# Patient Record
Sex: Female | Born: 1960 | Race: White | Hispanic: No | Marital: Married | Smoking: Never smoker
Health system: Southern US, Community
[De-identification: ages and names within clinical notes are randomized; demographics above are authoritative.]

## PROBLEM LIST (undated history)

## (undated) DIAGNOSIS — E079 Disorder of thyroid, unspecified: Secondary | ICD-10-CM

## (undated) DIAGNOSIS — K589 Irritable bowel syndrome without diarrhea: Secondary | ICD-10-CM

## (undated) DIAGNOSIS — IMO0001 Reserved for inherently not codable concepts without codable children: Secondary | ICD-10-CM

## (undated) DIAGNOSIS — G43909 Migraine, unspecified, not intractable, without status migrainosus: Secondary | ICD-10-CM

## (undated) DIAGNOSIS — K219 Gastro-esophageal reflux disease without esophagitis: Secondary | ICD-10-CM

## (undated) HISTORY — DX: Migraine, unspecified, not intractable, without status migrainosus: G43.909

## (undated) HISTORY — PX: AUGMENTATION MAMMAPLASTY: SUR837

## (undated) HISTORY — DX: Gastro-esophageal reflux disease without esophagitis: K21.9

## (undated) HISTORY — DX: Irritable bowel syndrome, unspecified: K58.9

## (undated) HISTORY — DX: Disorder of thyroid, unspecified: E07.9

## (undated) HISTORY — DX: Reserved for inherently not codable concepts without codable children: IMO0001

---

## 1997-07-24 ENCOUNTER — Other Ambulatory Visit: Admission: RE | Admit: 1997-07-24 | Discharge: 1997-07-24 | Payer: Self-pay | Admitting: Gynecology

## 1998-07-28 ENCOUNTER — Other Ambulatory Visit: Admission: RE | Admit: 1998-07-28 | Discharge: 1998-07-28 | Payer: Self-pay | Admitting: Gynecology

## 1999-10-14 ENCOUNTER — Other Ambulatory Visit: Admission: RE | Admit: 1999-10-14 | Discharge: 1999-10-14 | Payer: Self-pay | Admitting: Gynecology

## 2000-10-17 ENCOUNTER — Other Ambulatory Visit: Admission: RE | Admit: 2000-10-17 | Discharge: 2000-10-17 | Payer: Self-pay | Admitting: Gynecology

## 2001-05-02 HISTORY — PX: LAPAROSCOPIC HYSTERECTOMY: SHX1926

## 2001-10-23 ENCOUNTER — Other Ambulatory Visit: Admission: RE | Admit: 2001-10-23 | Discharge: 2001-10-23 | Payer: Self-pay | Admitting: Gynecology

## 2001-11-12 ENCOUNTER — Inpatient Hospital Stay (HOSPITAL_COMMUNITY): Admission: RE | Admit: 2001-11-12 | Discharge: 2001-11-14 | Payer: Self-pay | Admitting: Gynecology

## 2001-11-12 ENCOUNTER — Encounter (INDEPENDENT_AMBULATORY_CARE_PROVIDER_SITE_OTHER): Payer: Self-pay | Admitting: *Deleted

## 2010-04-15 ENCOUNTER — Encounter
Admission: RE | Admit: 2010-04-15 | Discharge: 2010-04-15 | Payer: Self-pay | Source: Home / Self Care | Attending: Gynecology | Admitting: Gynecology

## 2010-08-26 ENCOUNTER — Other Ambulatory Visit: Payer: Self-pay | Admitting: Gynecology

## 2010-08-26 DIAGNOSIS — Z09 Encounter for follow-up examination after completed treatment for conditions other than malignant neoplasm: Secondary | ICD-10-CM

## 2010-09-13 ENCOUNTER — Ambulatory Visit
Admission: RE | Admit: 2010-09-13 | Discharge: 2010-09-13 | Disposition: A | Discharge status: Home / Self Care | Payer: BC Managed Care – PPO | Source: Ambulatory Visit | Attending: Gynecology | Admitting: Gynecology

## 2010-09-13 DIAGNOSIS — Z09 Encounter for follow-up examination after completed treatment for conditions other than malignant neoplasm: Secondary | ICD-10-CM

## 2010-09-17 NOTE — Op Note (Signed)
[facility]  Patient:    Leslie Walton, Leslie Walton Visit Number: [id number] MRN: [id number]          Service Type: GYN Location: [id number] [id number] Attending Physician:  Susa Raring Dictated by:   Luvenia Redden, M.D. Proc. Date: 11/12/01 Admit Date:  11/12/2001 Discharge Date: 11/14/2001                             Operative Report  PREOPERATIVE DIAGNOSES:       1. Chronic pelvic pain, dysmenorrhea,                                  dyspareunia.                               2. Severe menstrual migraines.                               3. Possible endometriosis.  POSTOPERATIVE DIAGNOSES:      1. Chronic pelvic pain, dysmenorrhea,                                  dyspareunia.                               2. Severe menstrual migraines.                               3. Pelvic endometriosis.  OPERATION:                    1. Total abdominal hysterectomy, bilateral                                  salpingo-oophorectomy.                               2. Electrocauterization of pelvic endometriosis.  SURGEON:                      Luvenia Redden, M.D.  ASSISTANT:                    Janeece Riggers. Dareen Piano, M.D.  ANESTHESIA:                   General endotracheal.  PROCEDURE:                    Under good anesthesia the patient was prepped and draped in a sterile manner.  Bladder was catheterized with a Foley catheter.  Pfannenstiel incision was made.  Bleeders were clamped and cauterized as encountered.  Peritoneum was entered sharply and opened vertically.  Upper abdominal organ exploration showed no abnormalities.  In the pelvis the uterus was retroflexed, was slightly enlarged and boggy. Previous tubal sterilization was apparent.  Ovaries appeared normal.  There was a corpus luteum present in the right ovary.  Down in the cul-de-sac below the cervix there were several  implants of typical powder burn appearance endometriosis.  The round ligaments were suture  ligated and divided.  Bladder flap was developed by sharp and blunt dissection.  The ureters were identified, were functioning, and were well below the operative site.  The infundibulopelvic ligaments were clamped, cut, and doubly ligated.  Uterine vessels were skeletonized, clamped, cut, and ligated.  Bladder was dissected down off the cervix further by blunt dissection.  The cardinal ligaments were clamped, cut, and ligated.  Uterosacral ligaments were clamped, cut, and ligated.  The vagina was entered anteriorly.  The cervix was circumscribed and the specimen removed.  The lateral vaginal apices were secured with figure-of-eight 0 Monocryl sutures and the cuff was closed with figure-of-eight sutures.  Individual bleeders underneath the bladder flap were electrocoagulated.  Pelvis was irrigated with warm lactated Ringers and this was aspirated.  There was a small bleeder at the right apex of the cuff and this was suture ligated with a Monocryl suture.  Ureters were again inspected and were functioning.  The urine was clear.  Packs were removed.  Retractor was removed.  The appendix was inspected and was normal.  After all the counts were correct the peritoneum was closed with continuous 2-0 Monocryl.  Rectus muscles approximated in the midline with continuous Monocryl.  Fascia was repaired with continuous 0 Monocryl.  The subcutaneous space was irrigated copiously.  There were small bleeders that were electrocoagulated.  Skin was then closed with wide skin staples.  Dry, sterile dressing was applied. Estimated blood loss was 50-100 cc.  None was replaced.  The patient tolerated the procedure well and was removed to recovery in good condition. Dictated by:   Luvenia Redden, M.D. Attending Physician:  Susa Raring DD:  11/12/01 TD:  11/15/01 Job: [id number]

## 2010-09-17 NOTE — Discharge Summary (Signed)
NAME:  Leslie, Walton NO.:  [id number]   MEDICAL RECORD NO.:  [id number]                   PATIENT TYPE:   LOCATION:                                       FACILITY:   PHYSICIAN:  Luvenia Redden, M.D.                DATE OF BIRTH:   DATE OF ADMISSION:  11/12/2001  DATE OF DISCHARGE:  11/14/2001                                 DISCHARGE SUMMARY   HISTORY OF PRESENT ILLNESS:  This patient is a 50 year old female admitted  to the hospital with long-term complaints of chronic pelvic pain,  dyspareunia, dysmenorrhea, and menstrual headaches.  Has been a little  response to conservative therapy and her symptoms have been increasing with  the passage of time.  She is now admitted for surgical treatment and  definitive therapy.   PHYSICAL EXAMINATION:  PELVIC:  Pertinent findings __________ pelvis were  external genitalia that is normal with a parous outlet.  The vagina is lined  with healthy mucosa.  The cervix smooth and well-supported.  The uterus is  anterior and normal size.  It is tender to palpation.  It is tender to  movement.  No adnexal masses can be palpated.  RECTAL:  Negative.   LABORATORY DATA:  Laboratory data including an admission hemogram,  urinalysis, and urine pregnancy test were all within normal limits.  The  pregnancy test was negative and the preop hemoglobin was 12, postop  hemoglobin 10.9.   HOSPITAL COURSE:  The patient was taken to the operating room where a total  abdominal hysterectomy bilateral salpingo-oophorectomy was performed.  She  also had peritoneal implants of endometriosis and these were destroyed by  electrocoagulation.  The patient's postop course was one of steady  improvement and no complications.  On the first postoperative day, she was  afebrile and taking p.o. fluids.  She had not passed flatus.  IV was removed  and she was ambulated.  On July 16th, she was afebrile and had no bleeding.  Staples were  removed from her wound and Steri-Strips applied.  The patient  was taking postop solids and was passing flatus.  She was discharged to her  home.   DISCHARGE INSTRUCTIONS:  The patient was instructed to eat a regular diet  and had limited activity including but not limited to no vigorous activity  and no vaginal penetration.  She was instructed not to drive for two weeks.   DISCHARGE MEDICATIONS:  She was given a prescription of Premarin 0.9 mg to  be taken orally daily.  She was given a prescription for Mepergan for her  pain as needed.   FOLLOW UP:  She is to return to my office in two weeks time for followup.   PATHOLOGY:  Examination of the tissue removed was benign.  The right ovary  had a hemorrhage corpus luteum cyst.  On the left side, there was  a  paratubal cyst and some cystic follicles.  Endometrium was benign.  The  myometrium had extensive adenomyosis.   FINAL DIAGNOSES:  1. Chronic pelvic pain, dysmenorrhea, and dyspareunia.  2. Extensive adenomyosis of the myometrium.  3. Left paratubal cyst.  4. Hemorrhagic corpus luteum cyst of the ovary.  5. Squamous metaplasia.  6. Pelvic peritoneal endometriosis.  7. Cyclic premenstrual headaches.   OPERATION:  1. Total abdominal hysterectomy, bilateral salpingo-oophorectomy.  2. Destruction of pelvic peritoneal implants of endometriosis.   CONDITION ON DISCHARGE:  Improved.                                                Luvenia Redden, M.D.    SWB/MEDQ  D:  01/17/2002  T:  01/18/2002  Job:  [id number]

## 2010-10-04 ENCOUNTER — Other Ambulatory Visit: Payer: Self-pay | Admitting: Gynecology

## 2011-05-03 HISTORY — PX: CHOLECYSTECTOMY: SHX55

## 2011-10-05 ENCOUNTER — Other Ambulatory Visit: Payer: Self-pay | Admitting: Gynecology

## 2012-03-01 ENCOUNTER — Other Ambulatory Visit: Payer: Self-pay | Admitting: Gynecology

## 2012-03-01 DIAGNOSIS — Z1231 Encounter for screening mammogram for malignant neoplasm of breast: Secondary | ICD-10-CM

## 2012-03-28 ENCOUNTER — Ambulatory Visit
Admission: RE | Admit: 2012-03-28 | Discharge: 2012-03-28 | Disposition: A | Discharge status: Home / Self Care | Payer: BC Managed Care – PPO | Source: Ambulatory Visit | Attending: Gynecology | Admitting: Gynecology

## 2012-03-28 DIAGNOSIS — Z1231 Encounter for screening mammogram for malignant neoplasm of breast: Secondary | ICD-10-CM

## 2012-10-17 ENCOUNTER — Other Ambulatory Visit: Payer: Self-pay | Admitting: Obstetrics and Gynecology

## 2013-10-01 ENCOUNTER — Other Ambulatory Visit: Payer: Self-pay

## 2013-10-01 DIAGNOSIS — Z1231 Encounter for screening mammogram for malignant neoplasm of breast: Secondary | ICD-10-CM

## 2013-10-18 ENCOUNTER — Ambulatory Visit
Admission: RE | Admit: 2013-10-18 | Discharge: 2013-10-18 | Disposition: A | Discharge status: Home / Self Care | Payer: BC Managed Care – PPO | Source: Ambulatory Visit

## 2013-10-18 ENCOUNTER — Encounter (INDEPENDENT_AMBULATORY_CARE_PROVIDER_SITE_OTHER): Payer: Self-pay

## 2013-10-18 DIAGNOSIS — Z1231 Encounter for screening mammogram for malignant neoplasm of breast: Secondary | ICD-10-CM

## 2014-02-28 ENCOUNTER — Ambulatory Visit (INDEPENDENT_AMBULATORY_CARE_PROVIDER_SITE_OTHER): Payer: BC Managed Care – PPO

## 2014-02-28 VITALS — BP 129/79 | HR 60 | Resp 12

## 2014-02-28 DIAGNOSIS — M79675 Pain in left toe(s): Secondary | ICD-10-CM

## 2014-02-28 DIAGNOSIS — S90122A Contusion of left lesser toe(s) without damage to nail, initial encounter: Secondary | ICD-10-CM

## 2014-02-28 DIAGNOSIS — B351 Tinea unguium: Secondary | ICD-10-CM

## 2014-02-28 NOTE — Progress Notes (Signed)
   Subjective:    Patient ID: Candee FurbishSusan Young, female    DOB: [birth year], 53 y.o.   MRN: [id number]  HPI  PT STATED LT FOOT 2ND MEDIAL SIDE OF THE TOENAIL IS BEEN SORE FOR 4-5 YEARS. THE TOENAIL IS BEEN THE SAME, BUT WHEN THE TOENAIL GET LONGER IS GETTING WORSE. THE TOENAIL GET AGGRAVATED BY PRESSURE. TRIED TO KEEP IT TRIM DOWN AND IT HELPS.  Review of Systems  Genitourinary: Negative for urgency.  Neurological: Positive for headaches.  All other systems reviewed and are negative.      Objective:   Physical Exam 53 year old white female well-developed well-nourished aren't attempts and presents at this time with pain second digit left foot more so than right there is some yellowing and thickening of the nail with some subungual debris. Patient does not report any injury or trauma however is a runner and currently the shoe she is wearing her size over on exam of the insole the shoe reveals that the toes are hitting always to the ends of her shoes she has second toes longer than the hallux on both feet resulting in contusion distal tuft of the second digit and second nail plate. Thickening debris of the nail plate otherwise neurovascular status is intact mild semirigid digital contractures noted no open wounds or ulcers no secondary infections       Assessment & Plan:  Assessment this time is slight hammertoe deformity with long second digit and contusion of second toe. Possible he secondary onychomycosis due to contusion plan at this time patient will apply topical Fungi-Nail to the affected nails will go up half a size in her shoes as appropriate reevaluate within 3 or 6 months if no significant improvement or if exacerbation occurs next  Alvan Dameichard Vaishnav Demartin DPM

## 2014-02-28 NOTE — Patient Instructions (Signed)
Onychomycosis/Fungal Toenails  WHAT IS IT? An infection that lies within the keratin of your nail plate that is caused by a fungus.  WHY ME? Fungal infections affect all ages, sexes, races, and creeds.  There may be many factors that predispose you to a fungal infection such as age, coexisting medical conditions such as diabetes, or an autoimmune disease; stress, medications, fatigue, genetics, etc.  Bottom line: fungus thrives in a warm, moist environment and your shoes offer such a location.  IS IT CONTAGIOUS? Theoretically, yes.  You do not want to share shoes, nail clippers or files with someone who has fungal toenails.  Walking around barefoot in the same room or sleeping in the same bed is unlikely to transfer the organism.  It is important to realize, however, that fungus can spread easily from one nail to the next on the same foot.  HOW DO WE TREAT THIS?  There are several ways to treat this condition.  Treatment may depend on many factors such as age, medications, pregnancy, liver and kidney conditions, etc.  It is best to ask your doctor which options are available to you.  1. No treatment.   Unlike many other medical concerns, you can live with this condition.  However for many people this can be a painful condition and may lead to ingrown toenails or a bacterial infection.  It is recommended that you keep the nails cut short to help reduce the amount of fungal nail. 2. Topical treatment.  These range from herbal remedies to prescription strength nail lacquers.  About 40-50% effective, topicals require twice daily application for approximately 9 to 12 months or until an entirely new nail has grown out.  The most effective topicals are medical grade medications available through physicians offices. 3. Oral antifungal medications.  With an 80-90% cure rate, the most common oral medication requires 3 to 4 months of therapy and stays in your system for a year as the new nail grows out.  Oral  antifungal medications do require blood work to make sure it is a safe drug for you.  A liver function panel will be performed prior to starting the medication and after the first month of treatment.  It is important to have the blood work performed to avoid any harmful side effects.  In general, this medication safe but blood work is required. 4. Laser Therapy.  This treatment is performed by applying a specialized laser to the affected nail plate.  This therapy is noninvasive, fast, and non-painful.  It is not covered by insurance and is therefore, out of pocket.  The results have been very good with a 80-95% cure rate.  The [facility] is the only practice in the area to offer this therapy. 5. Permanent Nail Avulsion.  Removing the entire nail so that a new nail will not grow back.   Obtain Fungi-Nail at any pharmacy without prescription. Apply to the affected nail twice daily for 3-6 months to help reduce the thickness discoloration and sensitivity. Discontinue if any skin irritation occurs

## 2014-09-03 ENCOUNTER — Other Ambulatory Visit: Payer: Self-pay

## 2014-09-03 DIAGNOSIS — Z1231 Encounter for screening mammogram for malignant neoplasm of breast: Secondary | ICD-10-CM

## 2014-10-23 ENCOUNTER — Ambulatory Visit
Admission: RE | Admit: 2014-10-23 | Discharge: 2014-10-23 | Disposition: A | Discharge status: Home / Self Care | Payer: BLUE CROSS/BLUE SHIELD | Source: Ambulatory Visit

## 2014-10-23 DIAGNOSIS — Z1231 Encounter for screening mammogram for malignant neoplasm of breast: Secondary | ICD-10-CM

## 2015-08-06 DIAGNOSIS — J3089 Other allergic rhinitis: Secondary | ICD-10-CM | POA: Diagnosis not present

## 2015-08-06 DIAGNOSIS — F4322 Adjustment disorder with anxiety: Secondary | ICD-10-CM | POA: Diagnosis not present

## 2015-08-21 DIAGNOSIS — F4322 Adjustment disorder with anxiety: Secondary | ICD-10-CM | POA: Diagnosis not present

## 2015-09-11 DIAGNOSIS — F4322 Adjustment disorder with anxiety: Secondary | ICD-10-CM | POA: Diagnosis not present

## 2015-09-16 DIAGNOSIS — F4322 Adjustment disorder with anxiety: Secondary | ICD-10-CM | POA: Diagnosis not present

## 2015-09-18 DIAGNOSIS — F4322 Adjustment disorder with anxiety: Secondary | ICD-10-CM | POA: Diagnosis not present

## 2015-10-02 DIAGNOSIS — F4322 Adjustment disorder with anxiety: Secondary | ICD-10-CM | POA: Diagnosis not present

## 2015-10-16 DIAGNOSIS — F4322 Adjustment disorder with anxiety: Secondary | ICD-10-CM | POA: Diagnosis not present

## 2015-11-13 DIAGNOSIS — F4322 Adjustment disorder with anxiety: Secondary | ICD-10-CM | POA: Diagnosis not present

## 2015-11-24 DIAGNOSIS — E28310 Symptomatic premature menopause: Secondary | ICD-10-CM | POA: Diagnosis not present

## 2015-11-24 DIAGNOSIS — E559 Vitamin D deficiency, unspecified: Secondary | ICD-10-CM | POA: Diagnosis not present

## 2015-11-24 DIAGNOSIS — R7989 Other specified abnormal findings of blood chemistry: Secondary | ICD-10-CM | POA: Diagnosis not present

## 2015-11-24 DIAGNOSIS — E039 Hypothyroidism, unspecified: Secondary | ICD-10-CM | POA: Diagnosis not present

## 2015-11-26 DIAGNOSIS — F4322 Adjustment disorder with anxiety: Secondary | ICD-10-CM | POA: Diagnosis not present

## 2015-12-09 DIAGNOSIS — F4322 Adjustment disorder with anxiety: Secondary | ICD-10-CM | POA: Diagnosis not present

## 2016-01-13 DIAGNOSIS — F4322 Adjustment disorder with anxiety: Secondary | ICD-10-CM | POA: Diagnosis not present

## 2016-01-19 DIAGNOSIS — Z1389 Encounter for screening for other disorder: Secondary | ICD-10-CM | POA: Diagnosis not present

## 2016-01-19 DIAGNOSIS — E039 Hypothyroidism, unspecified: Secondary | ICD-10-CM | POA: Diagnosis not present

## 2016-01-19 DIAGNOSIS — J309 Allergic rhinitis, unspecified: Secondary | ICD-10-CM | POA: Diagnosis not present

## 2016-01-29 DIAGNOSIS — F4322 Adjustment disorder with anxiety: Secondary | ICD-10-CM | POA: Diagnosis not present

## 2016-02-05 DIAGNOSIS — F4322 Adjustment disorder with anxiety: Secondary | ICD-10-CM | POA: Diagnosis not present

## 2016-02-26 DIAGNOSIS — F4322 Adjustment disorder with anxiety: Secondary | ICD-10-CM | POA: Diagnosis not present

## 2016-03-04 DIAGNOSIS — F4322 Adjustment disorder with anxiety: Secondary | ICD-10-CM | POA: Diagnosis not present

## 2016-03-17 DIAGNOSIS — F4322 Adjustment disorder with anxiety: Secondary | ICD-10-CM | POA: Diagnosis not present

## 2016-03-21 DIAGNOSIS — E28310 Symptomatic premature menopause: Secondary | ICD-10-CM | POA: Diagnosis not present

## 2016-03-21 DIAGNOSIS — R7989 Other specified abnormal findings of blood chemistry: Secondary | ICD-10-CM | POA: Diagnosis not present

## 2016-03-21 DIAGNOSIS — E559 Vitamin D deficiency, unspecified: Secondary | ICD-10-CM | POA: Diagnosis not present

## 2016-03-21 DIAGNOSIS — E039 Hypothyroidism, unspecified: Secondary | ICD-10-CM | POA: Diagnosis not present

## 2016-03-31 DIAGNOSIS — F4322 Adjustment disorder with anxiety: Secondary | ICD-10-CM | POA: Diagnosis not present

## 2016-05-05 DIAGNOSIS — J019 Acute sinusitis, unspecified: Secondary | ICD-10-CM | POA: Diagnosis not present

## 2016-05-05 DIAGNOSIS — B9689 Other specified bacterial agents as the cause of diseases classified elsewhere: Secondary | ICD-10-CM | POA: Diagnosis not present

## 2016-05-06 DIAGNOSIS — F4322 Adjustment disorder with anxiety: Secondary | ICD-10-CM | POA: Diagnosis not present

## 2016-06-08 DIAGNOSIS — Z23 Encounter for immunization: Secondary | ICD-10-CM | POA: Diagnosis not present

## 2016-06-10 DIAGNOSIS — F4322 Adjustment disorder with anxiety: Secondary | ICD-10-CM | POA: Diagnosis not present

## 2016-07-08 DIAGNOSIS — F4322 Adjustment disorder with anxiety: Secondary | ICD-10-CM | POA: Diagnosis not present

## 2016-07-29 DIAGNOSIS — D8989 Other specified disorders involving the immune mechanism, not elsewhere classified: Secondary | ICD-10-CM | POA: Diagnosis not present

## 2016-07-29 DIAGNOSIS — Z79890 Hormone replacement therapy (postmenopausal): Secondary | ICD-10-CM | POA: Diagnosis not present

## 2016-07-29 DIAGNOSIS — G43101 Migraine with aura, not intractable, with status migrainosus: Secondary | ICD-10-CM | POA: Diagnosis not present

## 2016-07-29 DIAGNOSIS — F39 Unspecified mood [affective] disorder: Secondary | ICD-10-CM | POA: Diagnosis not present

## 2016-07-29 DIAGNOSIS — E7212 Methylenetetrahydrofolate reductase deficiency: Secondary | ICD-10-CM | POA: Diagnosis not present

## 2016-07-29 DIAGNOSIS — R790 Abnormal level of blood mineral: Secondary | ICD-10-CM | POA: Diagnosis not present

## 2016-07-29 DIAGNOSIS — G4700 Insomnia, unspecified: Secondary | ICD-10-CM | POA: Diagnosis not present

## 2016-07-29 DIAGNOSIS — E559 Vitamin D deficiency, unspecified: Secondary | ICD-10-CM | POA: Diagnosis not present

## 2016-07-29 DIAGNOSIS — N951 Menopausal and female climacteric states: Secondary | ICD-10-CM | POA: Diagnosis not present

## 2016-07-29 DIAGNOSIS — E039 Hypothyroidism, unspecified: Secondary | ICD-10-CM | POA: Diagnosis not present

## 2016-08-09 DIAGNOSIS — F4322 Adjustment disorder with anxiety: Secondary | ICD-10-CM | POA: Diagnosis not present

## 2016-09-20 DIAGNOSIS — E039 Hypothyroidism, unspecified: Secondary | ICD-10-CM | POA: Diagnosis not present

## 2016-09-20 DIAGNOSIS — R5382 Chronic fatigue, unspecified: Secondary | ICD-10-CM | POA: Diagnosis not present

## 2016-09-20 DIAGNOSIS — Z79890 Hormone replacement therapy (postmenopausal): Secondary | ICD-10-CM | POA: Diagnosis not present

## 2016-09-20 DIAGNOSIS — N951 Menopausal and female climacteric states: Secondary | ICD-10-CM | POA: Diagnosis not present

## 2016-09-23 ENCOUNTER — Other Ambulatory Visit: Payer: Self-pay | Admitting: Obstetrics & Gynecology

## 2016-09-23 ENCOUNTER — Other Ambulatory Visit: Payer: Self-pay | Admitting: Family Medicine

## 2016-09-23 DIAGNOSIS — Z1231 Encounter for screening mammogram for malignant neoplasm of breast: Secondary | ICD-10-CM

## 2016-10-04 DIAGNOSIS — Z01419 Encounter for gynecological examination (general) (routine) without abnormal findings: Secondary | ICD-10-CM | POA: Diagnosis not present

## 2016-10-04 DIAGNOSIS — Z6826 Body mass index (BMI) 26.0-26.9, adult: Secondary | ICD-10-CM | POA: Diagnosis not present

## 2016-10-12 DIAGNOSIS — J0100 Acute maxillary sinusitis, unspecified: Secondary | ICD-10-CM | POA: Diagnosis not present

## 2016-10-18 DIAGNOSIS — N951 Menopausal and female climacteric states: Secondary | ICD-10-CM | POA: Diagnosis not present

## 2016-10-18 DIAGNOSIS — R5382 Chronic fatigue, unspecified: Secondary | ICD-10-CM | POA: Diagnosis not present

## 2016-10-18 DIAGNOSIS — Z79890 Hormone replacement therapy (postmenopausal): Secondary | ICD-10-CM | POA: Diagnosis not present

## 2016-11-01 ENCOUNTER — Ambulatory Visit
Admission: RE | Admit: 2016-11-01 | Discharge: 2016-11-01 | Disposition: A | Discharge status: Home / Self Care | Payer: BLUE CROSS/BLUE SHIELD | Source: Ambulatory Visit | Attending: Family Medicine | Admitting: Family Medicine

## 2016-11-01 DIAGNOSIS — Z1231 Encounter for screening mammogram for malignant neoplasm of breast: Secondary | ICD-10-CM

## 2016-11-03 ENCOUNTER — Other Ambulatory Visit: Payer: Self-pay | Admitting: Family Medicine

## 2016-11-03 DIAGNOSIS — R928 Other abnormal and inconclusive findings on diagnostic imaging of breast: Secondary | ICD-10-CM

## 2016-11-08 ENCOUNTER — Other Ambulatory Visit: Payer: Self-pay | Admitting: Family Medicine

## 2016-11-08 ENCOUNTER — Ambulatory Visit
Admission: RE | Admit: 2016-11-08 | Discharge: 2016-11-08 | Disposition: A | Discharge status: Home / Self Care | Payer: BLUE CROSS/BLUE SHIELD | Source: Ambulatory Visit | Attending: Family Medicine | Admitting: Family Medicine

## 2016-11-08 DIAGNOSIS — N6001 Solitary cyst of right breast: Secondary | ICD-10-CM

## 2016-11-08 DIAGNOSIS — N6489 Other specified disorders of breast: Secondary | ICD-10-CM | POA: Diagnosis not present

## 2016-11-08 DIAGNOSIS — R928 Other abnormal and inconclusive findings on diagnostic imaging of breast: Secondary | ICD-10-CM

## 2016-11-08 DIAGNOSIS — R922 Inconclusive mammogram: Secondary | ICD-10-CM | POA: Diagnosis not present

## 2016-11-10 ENCOUNTER — Ambulatory Visit
Admission: RE | Admit: 2016-11-10 | Discharge: 2016-11-10 | Disposition: A | Discharge status: Home / Self Care | Payer: BLUE CROSS/BLUE SHIELD | Source: Ambulatory Visit | Attending: Family Medicine | Admitting: Family Medicine

## 2016-11-10 DIAGNOSIS — N6001 Solitary cyst of right breast: Secondary | ICD-10-CM | POA: Diagnosis not present

## 2017-01-11 DIAGNOSIS — R0982 Postnasal drip: Secondary | ICD-10-CM | POA: Diagnosis not present

## 2017-01-11 DIAGNOSIS — J209 Acute bronchitis, unspecified: Secondary | ICD-10-CM | POA: Diagnosis not present

## 2017-01-11 DIAGNOSIS — J309 Allergic rhinitis, unspecified: Secondary | ICD-10-CM | POA: Diagnosis not present

## 2017-01-11 DIAGNOSIS — G43701 Chronic migraine without aura, not intractable, with status migrainosus: Secondary | ICD-10-CM | POA: Diagnosis not present

## 2017-01-15 DIAGNOSIS — R05 Cough: Secondary | ICD-10-CM | POA: Diagnosis not present

## 2017-01-15 DIAGNOSIS — R062 Wheezing: Secondary | ICD-10-CM | POA: Diagnosis not present

## 2017-01-27 DIAGNOSIS — J189 Pneumonia, unspecified organism: Secondary | ICD-10-CM | POA: Diagnosis not present

## 2017-03-02 DIAGNOSIS — B9689 Other specified bacterial agents as the cause of diseases classified elsewhere: Secondary | ICD-10-CM | POA: Diagnosis not present

## 2017-03-02 DIAGNOSIS — Z6825 Body mass index (BMI) 25.0-25.9, adult: Secondary | ICD-10-CM | POA: Diagnosis not present

## 2017-03-02 DIAGNOSIS — N760 Acute vaginitis: Secondary | ICD-10-CM | POA: Diagnosis not present

## 2017-03-02 DIAGNOSIS — N3000 Acute cystitis without hematuria: Secondary | ICD-10-CM | POA: Diagnosis not present

## 2017-03-14 DIAGNOSIS — Z6826 Body mass index (BMI) 26.0-26.9, adult: Secondary | ICD-10-CM | POA: Diagnosis not present

## 2017-03-14 DIAGNOSIS — A5901 Trichomonal vulvovaginitis: Secondary | ICD-10-CM | POA: Diagnosis not present

## 2017-03-14 DIAGNOSIS — R102 Pelvic and perineal pain: Secondary | ICD-10-CM | POA: Diagnosis not present

## 2017-03-14 DIAGNOSIS — N3090 Cystitis, unspecified without hematuria: Secondary | ICD-10-CM | POA: Diagnosis not present

## 2017-07-15 DIAGNOSIS — J069 Acute upper respiratory infection, unspecified: Secondary | ICD-10-CM | POA: Diagnosis not present

## 2017-07-18 DIAGNOSIS — F4322 Adjustment disorder with anxiety: Secondary | ICD-10-CM | POA: Diagnosis not present

## 2017-07-28 DIAGNOSIS — F4322 Adjustment disorder with anxiety: Secondary | ICD-10-CM | POA: Diagnosis not present

## 2017-08-18 DIAGNOSIS — F4322 Adjustment disorder with anxiety: Secondary | ICD-10-CM | POA: Diagnosis not present

## 2017-08-31 DIAGNOSIS — F4322 Adjustment disorder with anxiety: Secondary | ICD-10-CM | POA: Diagnosis not present

## 2017-09-14 DIAGNOSIS — F4322 Adjustment disorder with anxiety: Secondary | ICD-10-CM | POA: Diagnosis not present

## 2017-09-15 DIAGNOSIS — Z79890 Hormone replacement therapy (postmenopausal): Secondary | ICD-10-CM | POA: Diagnosis not present

## 2017-09-15 DIAGNOSIS — N951 Menopausal and female climacteric states: Secondary | ICD-10-CM | POA: Diagnosis not present

## 2017-09-15 DIAGNOSIS — E559 Vitamin D deficiency, unspecified: Secondary | ICD-10-CM | POA: Diagnosis not present

## 2017-09-15 DIAGNOSIS — E039 Hypothyroidism, unspecified: Secondary | ICD-10-CM | POA: Diagnosis not present

## 2017-09-29 DIAGNOSIS — F4322 Adjustment disorder with anxiety: Secondary | ICD-10-CM | POA: Diagnosis not present

## 2017-10-04 ENCOUNTER — Other Ambulatory Visit: Payer: Self-pay | Admitting: Obstetrics & Gynecology

## 2017-10-04 ENCOUNTER — Other Ambulatory Visit: Payer: Self-pay | Admitting: Family Medicine

## 2017-10-04 DIAGNOSIS — Z1231 Encounter for screening mammogram for malignant neoplasm of breast: Secondary | ICD-10-CM

## 2017-10-10 DIAGNOSIS — Z01419 Encounter for gynecological examination (general) (routine) without abnormal findings: Secondary | ICD-10-CM | POA: Diagnosis not present

## 2017-10-10 DIAGNOSIS — Z6826 Body mass index (BMI) 26.0-26.9, adult: Secondary | ICD-10-CM | POA: Diagnosis not present

## 2017-10-13 DIAGNOSIS — F4322 Adjustment disorder with anxiety: Secondary | ICD-10-CM | POA: Diagnosis not present

## 2017-11-21 ENCOUNTER — Ambulatory Visit
Admission: RE | Admit: 2017-11-21 | Discharge: 2017-11-21 | Disposition: A | Discharge status: Home / Self Care | Payer: BLUE CROSS/BLUE SHIELD | Source: Ambulatory Visit | Attending: Obstetrics & Gynecology | Admitting: Obstetrics & Gynecology

## 2017-11-21 ENCOUNTER — Ambulatory Visit: Payer: BLUE CROSS/BLUE SHIELD

## 2017-11-21 DIAGNOSIS — Z1231 Encounter for screening mammogram for malignant neoplasm of breast: Secondary | ICD-10-CM

## 2017-11-22 DIAGNOSIS — F4322 Adjustment disorder with anxiety: Secondary | ICD-10-CM | POA: Diagnosis not present

## 2017-11-27 DIAGNOSIS — F4322 Adjustment disorder with anxiety: Secondary | ICD-10-CM | POA: Diagnosis not present

## 2017-12-06 DIAGNOSIS — G43701 Chronic migraine without aura, not intractable, with status migrainosus: Secondary | ICD-10-CM | POA: Diagnosis not present

## 2017-12-06 DIAGNOSIS — E039 Hypothyroidism, unspecified: Secondary | ICD-10-CM | POA: Diagnosis not present

## 2017-12-06 DIAGNOSIS — J309 Allergic rhinitis, unspecified: Secondary | ICD-10-CM | POA: Diagnosis not present

## 2017-12-06 DIAGNOSIS — R0982 Postnasal drip: Secondary | ICD-10-CM | POA: Diagnosis not present

## 2017-12-28 DIAGNOSIS — F4322 Adjustment disorder with anxiety: Secondary | ICD-10-CM | POA: Diagnosis not present

## 2018-01-16 DIAGNOSIS — F419 Anxiety disorder, unspecified: Secondary | ICD-10-CM | POA: Diagnosis not present

## 2018-01-16 DIAGNOSIS — R5381 Other malaise: Secondary | ICD-10-CM | POA: Diagnosis not present

## 2018-01-16 DIAGNOSIS — R4189 Other symptoms and signs involving cognitive functions and awareness: Secondary | ICD-10-CM | POA: Diagnosis not present

## 2018-01-16 DIAGNOSIS — D518 Other vitamin B12 deficiency anemias: Secondary | ICD-10-CM | POA: Diagnosis not present

## 2018-01-16 DIAGNOSIS — G4709 Other insomnia: Secondary | ICD-10-CM | POA: Diagnosis not present

## 2018-01-16 DIAGNOSIS — N951 Menopausal and female climacteric states: Secondary | ICD-10-CM | POA: Diagnosis not present

## 2018-01-16 DIAGNOSIS — E039 Hypothyroidism, unspecified: Secondary | ICD-10-CM | POA: Diagnosis not present

## 2018-02-08 DIAGNOSIS — F4322 Adjustment disorder with anxiety: Secondary | ICD-10-CM | POA: Diagnosis not present

## 2018-03-23 DIAGNOSIS — F4322 Adjustment disorder with anxiety: Secondary | ICD-10-CM | POA: Diagnosis not present

## 2018-04-28 DIAGNOSIS — T7840XA Allergy, unspecified, initial encounter: Secondary | ICD-10-CM | POA: Diagnosis not present

## 2018-06-01 DIAGNOSIS — N951 Menopausal and female climacteric states: Secondary | ICD-10-CM | POA: Diagnosis not present

## 2018-06-01 DIAGNOSIS — E039 Hypothyroidism, unspecified: Secondary | ICD-10-CM | POA: Diagnosis not present

## 2018-08-29 DIAGNOSIS — J069 Acute upper respiratory infection, unspecified: Secondary | ICD-10-CM | POA: Diagnosis not present

## 2018-08-31 DIAGNOSIS — N951 Menopausal and female climacteric states: Secondary | ICD-10-CM | POA: Diagnosis not present

## 2018-09-27 DIAGNOSIS — Z6827 Body mass index (BMI) 27.0-27.9, adult: Secondary | ICD-10-CM | POA: Diagnosis not present

## 2018-09-27 DIAGNOSIS — S01332A Puncture wound without foreign body of left ear, initial encounter: Secondary | ICD-10-CM | POA: Diagnosis not present

## 2018-10-31 DIAGNOSIS — W57XXXA Bitten or stung by nonvenomous insect and other nonvenomous arthropods, initial encounter: Secondary | ICD-10-CM | POA: Diagnosis not present

## 2018-10-31 DIAGNOSIS — A938 Other specified arthropod-borne viral fevers: Secondary | ICD-10-CM | POA: Diagnosis not present

## 2018-11-01 DIAGNOSIS — G5621 Lesion of ulnar nerve, right upper limb: Secondary | ICD-10-CM | POA: Diagnosis not present

## 2018-11-01 DIAGNOSIS — G5603 Carpal tunnel syndrome, bilateral upper limbs: Secondary | ICD-10-CM | POA: Diagnosis not present

## 2018-11-01 DIAGNOSIS — G5622 Lesion of ulnar nerve, left upper limb: Secondary | ICD-10-CM | POA: Diagnosis not present

## 2018-11-20 DIAGNOSIS — Z01419 Encounter for gynecological examination (general) (routine) without abnormal findings: Secondary | ICD-10-CM | POA: Diagnosis not present

## 2018-11-20 DIAGNOSIS — Z6826 Body mass index (BMI) 26.0-26.9, adult: Secondary | ICD-10-CM | POA: Diagnosis not present

## 2018-11-26 DIAGNOSIS — G5623 Lesion of ulnar nerve, bilateral upper limbs: Secondary | ICD-10-CM | POA: Diagnosis not present

## 2018-12-14 ENCOUNTER — Other Ambulatory Visit: Payer: Self-pay | Admitting: Obstetrics & Gynecology

## 2018-12-14 DIAGNOSIS — Z1231 Encounter for screening mammogram for malignant neoplasm of breast: Secondary | ICD-10-CM

## 2019-01-25 ENCOUNTER — Ambulatory Visit: Payer: BLUE CROSS/BLUE SHIELD

## 2019-01-28 ENCOUNTER — Other Ambulatory Visit: Payer: Self-pay

## 2019-01-28 ENCOUNTER — Ambulatory Visit
Admission: RE | Admit: 2019-01-28 | Discharge: 2019-01-28 | Disposition: A | Discharge status: Home / Self Care | Payer: BC Managed Care – PPO | Source: Ambulatory Visit | Attending: Obstetrics & Gynecology | Admitting: Obstetrics & Gynecology

## 2019-01-28 DIAGNOSIS — Z1231 Encounter for screening mammogram for malignant neoplasm of breast: Secondary | ICD-10-CM

## 2019-02-01 DIAGNOSIS — R5383 Other fatigue: Secondary | ICD-10-CM | POA: Diagnosis not present

## 2019-02-18 DIAGNOSIS — K219 Gastro-esophageal reflux disease without esophagitis: Secondary | ICD-10-CM | POA: Diagnosis not present

## 2019-04-01 DIAGNOSIS — R030 Elevated blood-pressure reading, without diagnosis of hypertension: Secondary | ICD-10-CM | POA: Diagnosis not present

## 2019-04-01 DIAGNOSIS — M7912 Myalgia of auxiliary muscles, head and neck: Secondary | ICD-10-CM | POA: Diagnosis not present

## 2019-04-01 DIAGNOSIS — J069 Acute upper respiratory infection, unspecified: Secondary | ICD-10-CM | POA: Diagnosis not present

## 2019-04-01 DIAGNOSIS — R0789 Other chest pain: Secondary | ICD-10-CM | POA: Diagnosis not present

## 2019-06-26 DIAGNOSIS — G5603 Carpal tunnel syndrome, bilateral upper limbs: Secondary | ICD-10-CM | POA: Diagnosis not present

## 2019-06-26 DIAGNOSIS — M79641 Pain in right hand: Secondary | ICD-10-CM | POA: Diagnosis not present

## 2019-06-26 DIAGNOSIS — M79642 Pain in left hand: Secondary | ICD-10-CM | POA: Diagnosis not present

## 2019-07-04 DIAGNOSIS — N951 Menopausal and female climacteric states: Secondary | ICD-10-CM | POA: Diagnosis not present

## 2019-07-04 DIAGNOSIS — F339 Major depressive disorder, recurrent, unspecified: Secondary | ICD-10-CM | POA: Diagnosis not present

## 2019-07-04 DIAGNOSIS — E039 Hypothyroidism, unspecified: Secondary | ICD-10-CM | POA: Diagnosis not present

## 2019-07-04 DIAGNOSIS — D518 Other vitamin B12 deficiency anemias: Secondary | ICD-10-CM | POA: Diagnosis not present

## 2019-08-09 DIAGNOSIS — D518 Other vitamin B12 deficiency anemias: Secondary | ICD-10-CM | POA: Diagnosis not present

## 2019-08-09 DIAGNOSIS — F339 Major depressive disorder, recurrent, unspecified: Secondary | ICD-10-CM | POA: Diagnosis not present

## 2019-08-09 DIAGNOSIS — E039 Hypothyroidism, unspecified: Secondary | ICD-10-CM | POA: Diagnosis not present

## 2019-08-09 DIAGNOSIS — F419 Anxiety disorder, unspecified: Secondary | ICD-10-CM | POA: Diagnosis not present

## 2019-08-21 DIAGNOSIS — M79641 Pain in right hand: Secondary | ICD-10-CM | POA: Diagnosis not present

## 2019-08-21 DIAGNOSIS — G5603 Carpal tunnel syndrome, bilateral upper limbs: Secondary | ICD-10-CM | POA: Diagnosis not present

## 2019-09-05 DIAGNOSIS — N951 Menopausal and female climacteric states: Secondary | ICD-10-CM | POA: Diagnosis not present

## 2019-10-05 DIAGNOSIS — L259 Unspecified contact dermatitis, unspecified cause: Secondary | ICD-10-CM | POA: Diagnosis not present

## 2019-12-03 DIAGNOSIS — Z6829 Body mass index (BMI) 29.0-29.9, adult: Secondary | ICD-10-CM | POA: Diagnosis not present

## 2019-12-03 DIAGNOSIS — Z01419 Encounter for gynecological examination (general) (routine) without abnormal findings: Secondary | ICD-10-CM | POA: Diagnosis not present

## 2019-12-06 ENCOUNTER — Other Ambulatory Visit: Payer: Self-pay | Admitting: Obstetrics & Gynecology

## 2019-12-06 DIAGNOSIS — Z1231 Encounter for screening mammogram for malignant neoplasm of breast: Secondary | ICD-10-CM

## 2020-01-29 ENCOUNTER — Ambulatory Visit: Payer: BC Managed Care – PPO

## 2020-04-07 ENCOUNTER — Encounter: Payer: Self-pay | Admitting: Dermatology

## 2020-04-07 ENCOUNTER — Ambulatory Visit (INDEPENDENT_AMBULATORY_CARE_PROVIDER_SITE_OTHER): Payer: BC Managed Care – PPO | Admitting: Dermatology

## 2020-04-07 ENCOUNTER — Other Ambulatory Visit: Payer: Self-pay

## 2020-04-07 DIAGNOSIS — L814 Other melanin hyperpigmentation: Secondary | ICD-10-CM | POA: Diagnosis not present

## 2020-04-07 DIAGNOSIS — Z1283 Encounter for screening for malignant neoplasm of skin: Secondary | ICD-10-CM | POA: Diagnosis not present

## 2020-04-07 DIAGNOSIS — R202 Paresthesia of skin: Secondary | ICD-10-CM | POA: Diagnosis not present

## 2020-04-14 ENCOUNTER — Encounter: Payer: Self-pay | Admitting: Dermatology

## 2020-04-14 NOTE — Progress Notes (Signed)
° °  Follow-Up Visit   Subjective  Leslie Walton is a 59 y.o. female who presents for the following: Skin Problem ( Left upper arm- rash that comes and goes- gets red & itches tx- TAC cream & capzasin hp).  Episodic itch left mid arm usually with no primary rash. Location:  Duration:  Quality:  Associated Signs/Symptoms: Modifying Factors:  Severity:  Timing: Context: Would also like several spots on skin check.  Objective  Well appearing patient in no apparent distress; mood and affect are within normal limits. Objective  Neck - Anterior: Upper torso, back, head, and neck skin exam- no atypical moles or non mole skin cancer  Objective  Right Malar Cheek: 8 mm subtle tan monochrome symmetric brown macule, dermoscopy without atypia.  Objective  Left Upper Arm - Posterior: History of episodic significant itching generally with no initial visible rash or redness until after scratching would fit a neurogenic itch.  In this area it is more properly called brachioradial pruritus than notalgia paresthetica.   All sun exposed areas plus back examined.   Assessment & Plan    Encounter for screening for malignant neoplasm of skin Neck - Anterior  Lentigo Right Malar Cheek  Treatment options discussed included benign neglect (if stable), topical agents, freezing, and chemical peel.  No intervention initiated.  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  Sample CeraVe itch relief provided     I, Janalyn Harder, MD, have reviewed all documentation for this visit.  The documentation on 04/14/20 for the exam, diagnosis, procedures, and orders are all accurate and complete.

## 2020-11-17 ENCOUNTER — Other Ambulatory Visit: Payer: Self-pay | Admitting: Obstetrics & Gynecology

## 2020-11-17 DIAGNOSIS — Z1231 Encounter for screening mammogram for malignant neoplasm of breast: Secondary | ICD-10-CM

## 2021-01-18 ENCOUNTER — Ambulatory Visit: Payer: BC Managed Care – PPO | Admitting: Dermatology

## 2021-01-18 ENCOUNTER — Ambulatory Visit
Admission: RE | Admit: 2021-01-18 | Discharge: 2021-01-18 | Disposition: A | Discharge status: Home / Self Care | Payer: BC Managed Care – PPO | Source: Ambulatory Visit | Attending: Obstetrics & Gynecology | Admitting: Obstetrics & Gynecology

## 2021-01-18 ENCOUNTER — Other Ambulatory Visit: Payer: Self-pay

## 2021-01-18 DIAGNOSIS — Z1231 Encounter for screening mammogram for malignant neoplasm of breast: Secondary | ICD-10-CM

## 2021-01-19 ENCOUNTER — Encounter: Payer: Self-pay | Admitting: *Deleted

## 2021-01-20 ENCOUNTER — Other Ambulatory Visit: Payer: Self-pay

## 2021-01-20 ENCOUNTER — Encounter: Payer: Self-pay | Admitting: Neurology

## 2021-01-20 ENCOUNTER — Ambulatory Visit (INDEPENDENT_AMBULATORY_CARE_PROVIDER_SITE_OTHER): Payer: BC Managed Care – PPO | Admitting: Neurology

## 2021-01-20 VITALS — BP 188/105 | HR 68 | Ht 66.0 in | Wt 179.2 lb

## 2021-01-20 DIAGNOSIS — R519 Headache, unspecified: Secondary | ICD-10-CM

## 2021-01-20 DIAGNOSIS — G43101 Migraine with aura, not intractable, with status migrainosus: Secondary | ICD-10-CM

## 2021-01-20 DIAGNOSIS — G43709 Chronic migraine without aura, not intractable, without status migrainosus: Secondary | ICD-10-CM | POA: Diagnosis not present

## 2021-01-20 DIAGNOSIS — H539 Unspecified visual disturbance: Secondary | ICD-10-CM

## 2021-01-20 DIAGNOSIS — R51 Headache with orthostatic component, not elsewhere classified: Secondary | ICD-10-CM

## 2021-01-20 MED ORDER — TOPIRAMATE 50 MG PO TABS: ORAL_TABLET | ORAL | 11 refills | Status: DC

## 2021-01-20 NOTE — Patient Instructions (Addendum)
One thing to consider with morning headache is sleep apnea; hold off for now Her blood pressure is elevated today, we retook it manually and it was 187/110 needs to follow up with primary care. She just had a cup of coffee, monitor at home and follow up with primary care.   Start Topiramate as prescribed preventative. If that doesn't work/side effects then propranolol. If needed, next we move onto newer meds Ajovy,emgality or Qulipta. Also botox.   Acute: Imitrex, naproxen, phenergan but can also consider Ubrelvy/nurtec in the furture or other triptans  MRI brain: will call   Bmp just for routine lab   There is increased risk for stroke in women with migraine with aura and a contraindication for the combined contraceptive pill for use by women who have migraine with aura. The risk for women with migraine without aura is lower. However other risk factors like smoking are far more likely to increase stroke risk than migraine. There is a recommendation for no smoking and for the use of OCPs without estrogen such as progestogen only pills particularly for women with migraine with aura.Marland Kitchen People who have migraine headaches with auras may be 3 times more likely to have a stroke caused by a blood clot, compared to migraine patients who don't see auras. Women who take hormone-replacement therapy may be 30 percent more likely to suffer a clot-based stroke than women not taking medication containing estrogen. Other risk factors like smoking and high blood pressure may be  much more important.  Topiramate Solution What is this medication? TOPIRAMATE (toe PYRE a mate) prevents and controls seizures in people with epilepsy. It may also be used to prevent migraine headaches. It works by calming overactive nerves in your body. This medicine may be used for other purposes; ask your health care provider or pharmacist if you have questions. COMMON BRAND NAME(S): EPRONTIA What should I tell my care team before I take  this medication? They need to know if you have any of these conditions: Bleeding disorder Kidney disease Lung disease Suicidal thoughts, plans or attempt An unusual or allergic reaction to topiramate, other medications, foods, dyes, or preservatives Pregnant or trying to get pregnant Breast-feeding How should I use this medication? Take this medication by mouth. Take it as directed on the prescription label at the same time every day. Use a specially marked oral syringe, spoon, or dropper to measure each dose. Ask your pharmacist if you do not have one. Household spoons are not accurate. You can take it with or without food. If it upsets your stomach, take it with food. Keep taking it unless your care team tells you to stop. A special MedGuide will be given to you by the pharmacist with each prescription and refill. Be sure to read this information carefully each time. Talk to your care team regarding the use of this medication in children. While it may be prescribed for children as young as 2 years for selected conditions, precautions do apply. Overdosage: If you think you have taken too much of this medicine contact a poison control center or emergency room at once. NOTE: This medicine is only for you. Do not share this medicine with others. What if I miss a dose? If you miss a dose, take it as soon as you can unless it is within 6 hours of the next dose. If it is within 6 hours of the next dose, skip the missed dose. Take the next dose at the normal time. Do not take  double or extra doses. What may interact with this medication? Acetazolamide Alcohol Antihistamines for allergy, cough, and cold Aspirin and aspirin-like medications Atropine Birth control pills Certain medications for anxiety or sleep Certain medications for bladder problems like oxybutynin, tolterodine Certain medications for depression like amitriptyline, fluoxetine, sertraline Certain medications for Parkinson's disease  like benztropine, trihexyphenidyl Certain medications for seizures like carbamazepine, lamotrigine, phenobarbital, phenytoin, primidone, valproic acid, zonisamide Certain medications for stomach problems like dicyclomine, hyoscyamine Certain medications for travel sickness like scopolamine Certain medications that treat or prevent blood clots like warfarin, enoxaparin, dalteparin, apixaban, dabigatran, and rivaroxaban Digoxin Diltiazem General anesthetics like halothane, isoflurane, methoxyflurane, propofol Glyburide Hydrochlorothiazide Ipratropium Lithium Medications that relax muscles Metformin Narcotic medications for pain NSAIDs, medications for pain and inflammation, like ibuprofen or naproxen Phenothiazines like chlorpromazine, mesoridazine, prochlorperazine, thioridazine Pioglitazone This list may not describe all possible interactions. Give your health care provider a list of all the medicines, herbs, non-prescription drugs, or dietary supplements you use. Also tell them if you smoke, drink alcohol, or use illegal drugs. Some items may interact with your medicine. What should I watch for while using this medication? Visit your care team for regular checks on your progress. Tell your care team if your symptoms do not start to get better or if they get worse. Do not suddenly stop taking this medication. You may develop a severe reaction. Your care team will tell you how much medication to take. If your care team wants you to stop the medication, the dose may be slowly lowered over time to avoid any side effects. Wear a medical ID bracelet or chain. Carry a card that describes your condition. List the medications and doses you take on the card. You may get drowsy or dizzy. Do not drive, use machinery, or do anything that needs mental alertness until you know how this medication affects you. Do not stand up or sit up quickly, especially if you are an older patient. This reduces the risk of  dizzy or fainting spells. Alcohol may interfere with the effects of this medication. Avoid alcoholic drinks. This medication may cause serious skin reactions. They can happen weeks to months after starting the medication. Contact your care team right away if you notice fevers or flu-like symptoms with a rash. The rash may be red or purple and then turn into blisters or peeling of the skin. Or, you might notice a red rash with swelling of the face, lips or lymph nodes in your neck or under your arms. Watch for new or worsening thoughts of suicide or depression. This includes sudden changes in mood, behaviors, or thoughts. These changes can happen at any time but are more common in the beginning of treatment or after a change in dose. Call your care team right away if you experience these thoughts or worsening depression. This medication may slow your child's growth if it is taken for a long time at high doses. Your care team will monitor your child's growth. Using this medication for a long time may weaken your bones. The risk of bone fractures may be increased. Talk to your care team about your bone health. Do not become pregnant while taking this medication. Hormone forms of birth control may not work as well with this medication. Talk to your care team about other forms of birth control. There is potential for serious harm to an unborn child. Tell your care team right away if you think you might be pregnant. What side effects may I notice  from receiving this medication? Side effects that you should report to your care team as soon as possible: Allergic reactions-skin rash, itching, hives, swelling of the face, lips, tongue, or throat High acid level-trouble breathing, unusual weakness or fatigue, confusion, headache, fast or irregular heartbeat, nausea, vomiting High ammonia level-unusual weakness or fatigue, confusion, loss of appetite, nausea, vomiting, seizures Fever that does not go away, decrease  in sweat Kidney stones-blood in the urine, pain or trouble passing urine, pain in the lower back or sides Redness, blistering, peeling or loosening of the skin, including inside the mouth Sudden eye pain or change in vision, such as blurry vision, seeing halos around lights, vision loss Thoughts of suicide or self-harm, worsening mood, feelings of depression Side effects that usually do not require medical attention (report to your care team if they continue or are bothersome): Burning or tingling sensation in hands or feet Difficulty with paying attention, memory, or speech Dizziness Drowsiness Fatigue Loss of appetite with weight loss Slow or sluggish movements of the body This list may not describe all possible side effects. Call your doctor for medical advice about side effects. You may report side effects to FDA at 1-800-FDA-1088. Where should I keep my medication? Keep out of the reach of children and pets. Store at room temperature between 20 and 25 degrees C (68 and 77 degrees F). Get rid of any unused medication 30 days after opening. To get rid of medications that are no longer needed or have expired: Take the medication to a medication take-back program. Check with your pharmacy or law enforcement to find a location. If you cannot return the medication, check the label or package insert to see if the medication should be thrown out in the garbage or flushed down the toilet. If you are not sure, ask your care team. If it is safe to put it in the trash, pour the medication out of the container. Mix the medication with cat litter, dirt, coffee grounds, or other unwanted substance. Seal the mixture in a bag or container. Put it in the trash. NOTE: This sheet is a summary. It may not cover all possible information. If you have questions about this medicine, talk to your doctor, pharmacist, or health care provider.  2022 Else[employer]/Gold Standard (2020-06-09 12:39:57)  Propranolol  Extended-Release Capsules What is this medication? PROPRANOLOL (proe PRAN oh lole) treats many conditions such as high blood pressure and heart disease. It may also be used to prevent chest pain (angina). It works by lowering your blood pressure and heart rate, making it easier for your heart to pump blood to the rest of your body. It can also be used to prevent migraine headaches. It works by relaxing the blood vessels in the brain that cause migraines. It belongs to a group of medications called beta blockers. This medicine may be used for other purposes; ask your health care provider or pharmacist if you have questions. COMMON BRAND NAME(S): Inderal LA, Inderal XL, InnoPran XL What should I tell my care team before I take this medication? They need to know if you have any of these conditions: Circulation problems, or blood vessel disease Diabetes History of heart attack or heart disease, vasospastic angina Kidney disease Liver disease Lung or breathing disease, like asthma or emphysema Pheochromocytoma Slow heart rate Thyroid disease An unusual or allergic reaction to propranolol, other beta-blockers, medications, foods, dyes, or preservatives Pregnant or trying to get pregnant Breast-feeding How should I use this medication? Take this medication by  mouth. Take it as directed on the prescription label at the same time every day. Do not cut, crush or chew this medication. Swallow the capsules whole. You can take it with or without food. If it upsets your stomach, take it with food. Keep taking it unless your care team tells you to stop. Talk to your care team about the use of this medication in children. Special care may be needed. Overdosage: If you think you have taken too much of this medicine contact a poison control center or emergency room at once. NOTE: This medicine is only for you. Do not share this medicine with others. What if I miss a dose? If you miss a dose, take it as soon  as you can. If it is almost time for your next dose, take only that dose. Do not take double or extra doses. What may interact with this medication? Do not take this medication with any of the following: Feverfew Phenothiazines like chlorpromazine, mesoridazine, prochlorperazine, thioridazine This medication may also interact with the following: Aluminum hydroxide gel Antipyrine Antiviral medications for HIV or AIDS Barbiturates like phenobarbital Certain medications for blood pressure, heart disease, irregular heart beat Cimetidine Ciprofloxacin Diazepam Fluconazole Haloperidol Isoniazid Medications for cholesterol like cholestyramine or colestipol Medications for mental depression Medications for migraine headache like almotriptan, eletriptan, frovatriptan, naratriptan, rizatriptan, sumatriptan, zolmitriptan NSAIDs, medications for pain and inflammation, like ibuprofen or naproxen Phenytoin Rifampin Teniposide Theophylline Thyroid medications Tolbutamide Warfarin Zileuton This list may not describe all possible interactions. Give your health care provider a list of all the medicines, herbs, non-prescription drugs, or dietary supplements you use. Also tell them if you smoke, drink alcohol, or use illegal drugs. Some items may interact with your medicine. What should I watch for while using this medication? Visit your care team for regular checks on your progress. Check your blood pressure as directed. Ask your care team what your blood pressure should be. Also, find out when you should contact him or her. Do not treat yourself for coughs, colds, or pain while you are using this medication without asking your care team for advice. Some medications may increase your blood pressure. You may get drowsy or dizzy. Do not drive, use machinery, or do anything that needs mental alertness until you know how this medication affects you. Do not stand up or sit up quickly, especially if you are  an older patient. This reduces the risk of dizzy or fainting spells. Alcohol may interfere with the effect of this medication. Avoid alcoholic drinks. This medication may increase blood sugar. Ask your care team if changes in diet or medications are needed if you have diabetes. Do not suddenly stop taking this medication. You may develop severe heart-related effects. Your care team will tell you how much medication to take. If your care team wants you to stop the medication, the dose may be slowly lowered over time to avoid any side effects. What side effects may I notice from receiving this medication? Side effects that you should report to your care team as soon as possible: Allergic reactions-skin rash, itching, hives, swelling of the face, lips, tongue, or throat Heart failure-shortness of breath, swelling of the ankles, feet, or hands, sudden weight gain, unusual weakness or fatigue Low blood pressure-dizziness, feeling faint or lightheaded, blurry vision Raynaud's-cool, numb, or painful fingers or toes that may change color from pale, to blue, to red Redness, blistering, peeling, or loosening of the skin, including inside the mouth Slow heartbeat-dizziness, feeling faint or  lightheaded, confusion, trouble breathing, unusual weakness or fatigue Worsening mood, feelings of depression Side effects that usually do not require medical attention (report to your care team if they continue or are bothersome): Change in sex drive or performance Diarrhea Dizziness Fatigue Headache This list may not describe all possible side effects. Call your doctor for medical advice about side effects. You may report side effects to FDA at 1-800-FDA-1088. Where should I keep my medication? Keep out of the reach of children and pets. Store at room temperature between 15 and 30 degrees C (59 and 86 degrees F). Protect from light and moisture. Keep the container tightly closed. Avoid exposure to extreme heat. Do not  freeze. Throw away any unused medication after the expiration date. NOTE: This sheet is a summary. It may not cover all possible information. If you have questions about this medicine, talk to your doctor, pharmacist, or health care provider.  2022 [employer]/Gold Standard (2020-08-26 13:53:22)

## 2021-01-20 NOTE — Progress Notes (Signed)
[facility]    Provider:  Dr Lucia Gaskins Requesting Provider: [name], NP Primary Care Provider:  [name], NP  CC:  Migraines  HPI:  Leslie Walton is a 60 y.o. female here as requested by Leslie Canter, NP for migraine with aura.  She has a past medical history of IBS, migraines, reflux, thyroid disease, B12 deficiency, depression, anxiety, insomnia, menopausal. Leslie Walton worked but could not afford. They started in her 93s, early 68s, they wax and waned over the years, she had a hysterectomy helped 3-4 years in 30s then startes coming back. Weather will trigger. This is the worst time of year for her, usually on the right and she can wake up with them, right eye, pulsating/pounding/throbbing, pressure, light and sound sensitivity, nausea, vomiting, she can't even more, has has them 24-72 hours, imitrex may help, weather makes it worse. Back in June she had an acular migraine, no pain but it starts in her vision and a tingly thing that expands and then goes away both eyes, 30 minutes, happens with dizziness as well. May or may not have a headache. Auras are afew times a month, random, not as often as the headaches without migraine that she has 3-4 times a week usually when she wakes up in the morning. She snores when she is tired, no excessive daytime somnolence. Over the last 3 months   Reviewed notes, labs and imaging from outside physicians, which showed:  I reviewed Leslie Walton's notes: Patient is complaining of dizziness with nausea and vomiting and sore throat, vision changes consistent with her previous migraines, acute medications did not help at that time and began having more dizziness and nausea, laying flat made it worse, standing helped relieve the symptoms, continue to have episodic multiple episodes of vomiting causing her to miss worse, Phenergan did not help or may be mild relief for the vomiting Continued to have dizziness, scopolamine patches help with  the dizziness, symptoms improved but then worsened again, she is continue to have vision changes and what I described as blurriness, examination in the office was normal no nystagmus or worsening of dizziness noted on Dix-Hallpike test, diagnosed with migraines, rapid COVID and strep were negative, patient has not seen neurology in several years and was referred for reestablishment.  I reviewed examination which was normal including general, head, eyes, ears, throat, skin, heart, lungs, abdomen and neurologic.  Diagnosed with migraine with aura, dizziness and nausea, she was started on q. left 30 mg for the migraine, dizziness was treated with meclizine and given scopolamine patches for her motion sickness.  Medications tried, from a thorough review of records, that can be used in migraine management include: Cymbalta, qulipta, meclizine,imitrex, compazine, zofran, naproxen. Amitriptyline, mitrex,Treximet, Alsuma, Maxalt (she saw Dr. Catalina Lunger in the past)  Review of Systems: Patient complains of symptoms per HPI as well as the following symptoms elevated BP. Pertinent negatives and positives per HPI. All others negative.   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   Social Determinants of Health   Financial Resource Strain: Not on file  Food Insecurity: Not on file  Transportation Needs: Not on file   Physical Activity: Not on file  Stress: Not on file  Social Connections: Not on file  Intimate Partner Violence: Not on file    Family History  Problem Relation Age of Onset   Migraines Mother    Pancreatic cancer Father    Breast cancer Neg Hx     Past Medical History:  Diagnosis Date   IBS (irritable bowel syndrome)    Migraines    Reflux    Thyroid disease     Patient Active Problem List   Diagnosis Date Noted   Migraine with aura and with status migrainosus, not intractable 01/20/2021   Chronic migraine without aura without status migrainosus, not intractable 01/20/2021     Past Surgical History:  Procedure Laterality Date   AUGMENTATION MAMMAPLASTY Bilateral    CHOLECYSTECTOMY  2013   LAPAROSCOPIC HYSTERECTOMY  2003    Current Outpatient Medications  Medication Sig Dispense Refill   ALPRAZolam (XANAX) 0.5 MG tablet Take 0.5 mg by mouth 2 (two) times daily as needed.     ARMOUR THYROID 60 MG tablet      DULoxetine (CYMBALTA) 60 MG capsule Take 60 mg by mouth daily.     pantoprazole (PROTONIX) 40 MG tablet pantoprazole 40 mg tablet,delayed release  Take 1 tablet every day by oral route in the morning.     topiramate (TOPAMAX) 50 MG tablet Start with one tablet(50mg ) at bedtime. In 2-3 weeks can increase to 2 pills (100mg ) 60 tablet 11   UNABLE TO FIND Med Name: Biote Hormone     No current facility-administered medications for this visit.    Allergies as of 01/20/2021 - Review Complete 01/20/2021  Allergen Reaction Noted   Codeine  02/28/2014   Cough & cold [chlorpheniramine-dm]  02/28/2014    Vitals: BP (!) 188/105 Comment: manual  Pulse 68   Ht 5\' 6"  (1.676 m)   Wt 179 lb 3.2 oz (81.3 kg)   BMI 28.92 kg/m  Last Weight:  Wt Readings from Last 1 Encounters:  01/20/21 179 lb 3.2 oz (81.3 kg)   Last Height:   Ht Readings from Last 1 Encounters:  01/20/21 5\' 6"  (1.676 m)     Physical exam: Exam: Gen: NAD, conversant, well nourised, obese,  well groomed                     CV: RRR, no MRG. No Carotid Bruits. No peripheral edema, warm, nontender Eyes: Conjunctivae clear without exudates or hemorrhage  Neuro: Detailed Neurologic Exam  Speech:    Speech is normal; fluent and spontaneous with normal comprehension.  Cognition:    The patient is oriented to person, place, and time;     recent and remote memory intact;     language fluent;     normal attention, concentration,     fund of knowledge Cranial Nerves:    The pupils are equal, round, and reactive to light. The fundi are normal and spontaneous venous pulsations are present. Visual fields are full to finger confrontation. Extraocular movements are intact. Trigeminal sensation is intact and the muscles of mastication are  normal. The face is symmetric. The palate elevates in the midline. Hearing intact. Voice is normal. Shoulder shrug is normal. The tongue has normal motion without fasciculations.   Coordination:    Normal   Gait:     normal.   Motor Observation:    No asymmetry, no atrophy, and no involuntary movements noted. Tone:    Normal muscle tone.    Posture:    Posture is normal. normal erect    Strength:    Strength is V/V in the upper and lower limbs.      Sensation: intact to LT     Reflex Exam:  DTR's:    Deep tendon reflexes in the upper and lower extremities are normal bilaterally.   Toes:    The toes are downgoing bilaterally.   Clonus:    Clonus is absent.    Assessment/Plan:  Patient with chronic migraine without aura and migraine with aura  Her blood pressure is elevated today, we retook it manually and it was 187/110 needs to follow up with primary care. She just had a cup of coffee, monitor at home and follow up with primary care.   Start Topiramate as prescribed preventative. If that doesn't work/side effects then propranolol. If needed, next we move onto newer meds Ajovy,emgality or Qulipta.   Acute: Imitrex, naproxen,  phenergan but can also consider Ubrelvy/nurtec in the furture or other triptans  MRI brain: MRI brain due to concerning symptoms of morning headaches, positional headaches,vision changes  to look for space occupying mass, chiari or intracranial hypertension (pseudotumor).  Discussed: risk of stroke  Orders Placed This Encounter  Procedures   MR BRAIN W WO CONTRAST   Basic Metabolic Panel   Meds ordered this encounter  Medications   topiramate (TOPAMAX) 50 MG tablet    Sig: Start with one tablet(50mg ) at bedtime. In 2-3 weeks can increase to 2 pills (100mg )    Dispense:  60 tablet    Refill:  11   Discussed: To prevent or relieve headaches, try the following: Cool Compress. Lie down and place a cool compress on your head.  Avoid headache triggers. If certain foods or odors seem to have triggered your migraines in the past, avoid them. A headache diary might help you identify triggers.  Include physical activity in your daily routine. Try a daily walk or other moderate aerobic exercise.  Manage stress. Find healthy ways to cope with the stressors, such as delegating tasks on your to-do list.  Practice relaxation techniques. Try deep breathing, yoga, massage and visualization.  Eat regularly. Eating regularly scheduled meals and maintaining a healthy diet might help prevent headaches. Also, drink plenty of fluids.  Follow a regular sleep schedule. Sleep deprivation might contribute to headaches Consider biofeedback. With this mind-body technique, you learn to control certain bodily functions -- such as muscle tension, heart rate and blood pressure -- to prevent headaches or reduce headache pain.    Proceed to emergency room if you experience new or worsening symptoms or symptoms do not resolve, if you have new neurologic symptoms or if headache is severe, or for any concerning symptom.   Provided education and documentation from American headache Society toolbox including articles on:  chronic migraine medication overuse headache, chronic migraines, prevention of migraines, behavioral and other nonpharmacologic treatments for headache.   Cc: , NP,  Leslie Canter, NP  Leslie Canter, MD  [facility] [address] [place], [place]  Phone [phone] Fax  [phone]  

## 2021-01-21 LAB — BASIC METABOLIC PANEL
BUN/Creatinine Ratio: 19 (ref 9–23)
BUN: 16 mg/dL (ref 6–24)
CO2: 26 mmol/L (ref 20–29)
Calcium: 9.8 mg/dL (ref 8.7–10.2)
Chloride: 100 mmol/L (ref 96–106)
Creatinine, Ser: 0.84 mg/dL (ref 0.57–1.00)
Glucose: 91 mg/dL (ref 65–99)
Potassium: 4.9 mmol/L (ref 3.5–5.2)
Sodium: 137 mmol/L (ref 134–144)
eGFR: 80 mL/min/{1.73_m2} (ref >59)

## 2021-05-10 NOTE — Progress Notes (Signed)
PATIENT: Leslie Walton DOB: April 19, 1961  REASON FOR VISIT: follow up HISTORY FROM: patient  Virtual Visit via Telephone Note  I connected with Leslie Walton on 05/11/21 at  9:00 AM EST by telephone and verified that I am speaking with the correct person using two identifiers.   I discussed the limitations, risks, security and privacy concerns of performing an evaluation and management service by telephone and the availability of in person appointments. I also discussed with the patient that there may be a patient responsible charge related to this service. The patient expressed understanding and agreed to proceed.   History of Present Illness:  05/11/21 ALL: Leslie Walton is a 61 y.o. female here today for follow up for migraines. She was seen in consult with Leslie Walton 12/2020. She was started on topiramate for prevention. She titrated dose to 100mg  daily but could not tolerate cognitive side effects. She decreased dose to 50 but reports symptoms did not improve. She discontinued med about 2 weeks ago. She was seen by PCP who started . She has tolerated meds very well. No migraines since starting these medications. PCP is monitoring BP. She reports BP reading of over 200 systolic with last dose of sumatriptan.   History (copied from Leslie Walton previous note)  HPI:  Leslie Walton is a 61 y.o. female here as requested by 42, NP for migraine with aura.  She has a past medical history of IBS, migraines, reflux, thyroid disease, B12 deficiency, depression, anxiety, insomnia, menopausal. Leslie Walton worked but could not afford. They started in her 36s, early 102s, they wax and waned over the years, she had a hysterectomy helped 3-4 years in 30s then startes coming back. Weather will trigger. This is the worst time of year for her, usually on the right and she can wake up with them, right eye, pulsating/pounding/throbbing, pressure, light and sound sensitivity, nausea, vomiting, she  can't even more, has has them 24-72 hours, imitrex may help, weather makes it worse. Back in June she had an acular migraine, no pain but it starts in her vision and a tingly thing that expands and then goes away both eyes, 30 minutes, happens with dizziness as well. May or may not have a headache. Auras are afew times a month, random, not as often as the headaches without migraine that she has 3-4 times a week usually when she wakes up in the morning. She snores when she is tired, no excessive daytime somnolence. Over the last 3 months    Reviewed notes, labs and imaging from outside physicians, which showed:   I reviewed Leslie Walton's notes: Patient is complaining of dizziness with nausea and vomiting and sore throat, vision changes consistent with her previous migraines, acute medications did not help at that time and began having more dizziness and nausea, laying flat made it worse, standing helped relieve the symptoms, continue to have episodic multiple episodes of vomiting causing her to miss worse, Phenergan did not help or may be mild relief for the vomiting Continued to have dizziness, scopolamine patches help with the dizziness, symptoms improved but then worsened again, she is continue to have vision changes and what I described as blurriness, examination in the office was normal no nystagmus or worsening of dizziness noted on Dix-Hallpike test, diagnosed with migraines, rapid COVID and strep were negative, patient has not seen neurology in several years and was referred for reestablishment.  I reviewed examination which was normal including general, head, eyes, ears, throat,  skin, heart, lungs, abdomen and neurologic.  Diagnosed with migraine with aura, dizziness and nausea, she was started on q. left 30 mg for the migraine, dizziness was treated with meclizine and given scopolamine patches for her motion sickness.   Medications tried, from a thorough review of records, that can be used in  migraine management include: Cymbalta, qulipta, meclizine,imitrex, compazine, zofran, naproxen. Amitriptyline, mitrex,Treximet, Alsuma, Maxalt (she saw Leslie. Catalina Lunger in the past)    Observations/Objective:  Generalized: Well developed, in no acute distress  Mentation: Alert oriented to time, place, history taking. Follows all commands speech and language fluent   Assessment and Plan:  61 y.o. year old female  has a past medical history of IBS (irritable bowel syndrome), Migraines, Reflux, and Thyroid disease. here with    ICD-10-CM   1. Chronic migraine without aura without status migrainosus, not intractable  G43.709       Leslie Walton was unable to tolerate topiramate due to cognitive side effects. PCP recently started her on Leslie Walton. She is tolerating these medicaitons and feels they work well. She was encouraged to continue current treatment plan with PCP. She may call us as needed for worsening. Healthy lifestyle habits encouraged. She will follow up with me as needed.   No orders of the defined types were placed in this encounter.   No orders of the defined types were placed in this encounter.    Follow Up Instructions:  I discussed the assessment and treatment plan with the patient. The patient was provided an opportunity to ask questions and all were answered. The patient agreed with the plan and demonstrated an understanding of the instructions.   The patient was advised to call back or seek an in-person evaluation if the symptoms worsen or if the condition fails to improve as anticipated.  I provided 15 minutes of non-face-to-face time during this encounter. Patient located at their place of residence during Mychart visit. Provider is in the office.    Shawnie Dapper, NP

## 2021-05-10 NOTE — Patient Instructions (Signed)
Below is our plan:  We will continue to monitor. Continue Leslie Walton per PCP direction. Let me know if you need me!  Please make sure you are staying well hydrated. I recommend 50-60 ounces daily. Well balanced diet and regular exercise encouraged. Consistent sleep schedule with 6-8 hours recommended.   Please continue follow up with care team as directed.   Follow up with me as needed   You may receive a survey regarding today's visit. I encourage you to leave honest feed back as I do use this information to improve patient care. Thank you for seeing me today!

## 2021-05-11 ENCOUNTER — Encounter: Payer: Self-pay | Admitting: Family Medicine

## 2021-05-11 ENCOUNTER — Telehealth (INDEPENDENT_AMBULATORY_CARE_PROVIDER_SITE_OTHER): Payer: BC Managed Care – PPO | Admitting: Family Medicine

## 2021-05-11 DIAGNOSIS — G43709 Chronic migraine without aura, not intractable, without status migrainosus: Secondary | ICD-10-CM | POA: Diagnosis not present

## 2022-07-04 IMAGING — MG DIGITAL SCREENING BREAST BILAT IMPLANT W/ TOMO W/ CAD
8 of 12 series · 8 of 28 positions shown · non-contrast
Comparison: Previous exam(s).

CLINICAL DATA: Screening.

EXAM:
DIGITAL SCREENING BILATERAL MAMMOGRAM WITH IMPLANTS, CAD AND
TOMOSYNTHESIS
TECHNIQUE: Bilateral screening digital craniocaudal and mediolateral oblique
mammograms were obtained. Bilateral screening digital breast
tomosynthesis was performed. The images were evaluated with
computer-aided detection. Standard and/or implant displaced views
were performed.

[L CC]
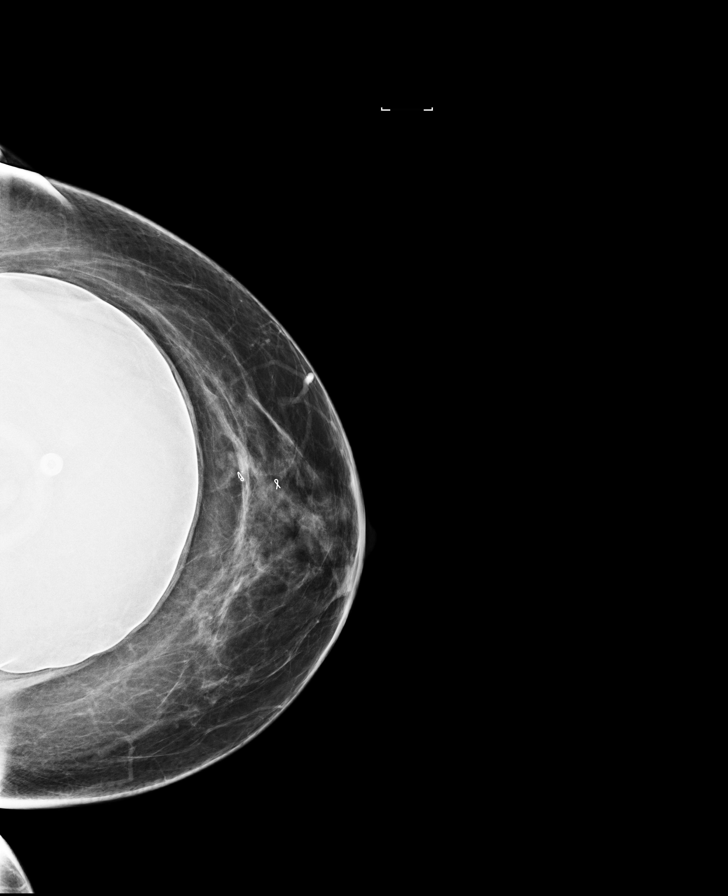

[R CC]
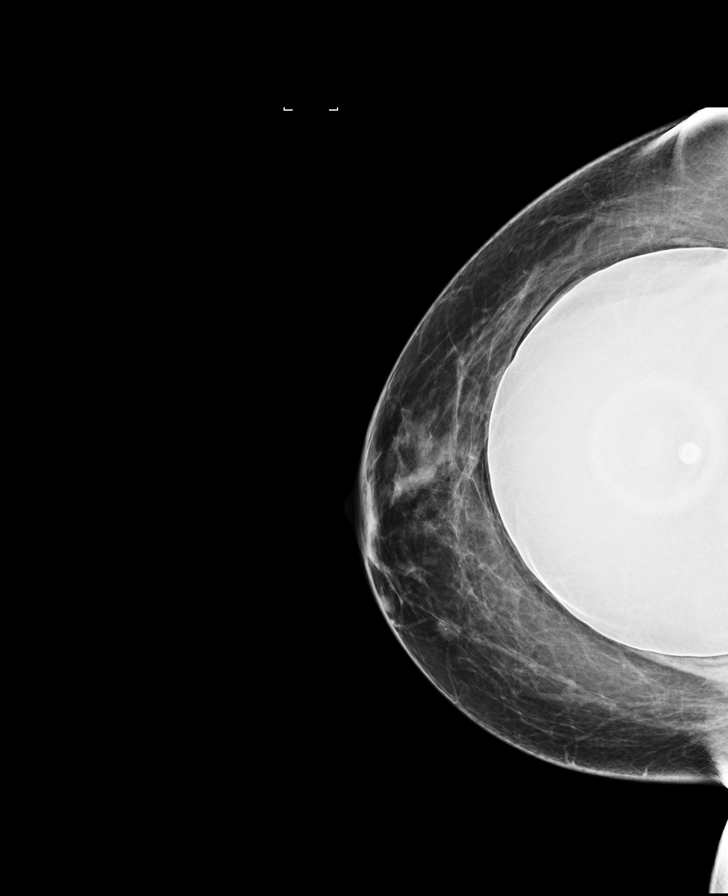

[L MLO]
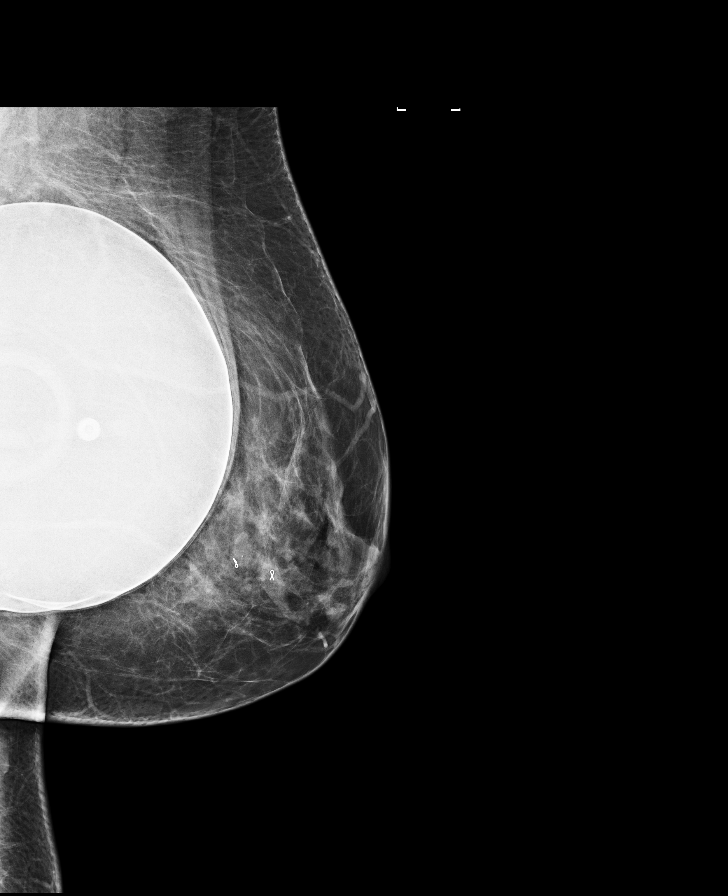

[R MLO]
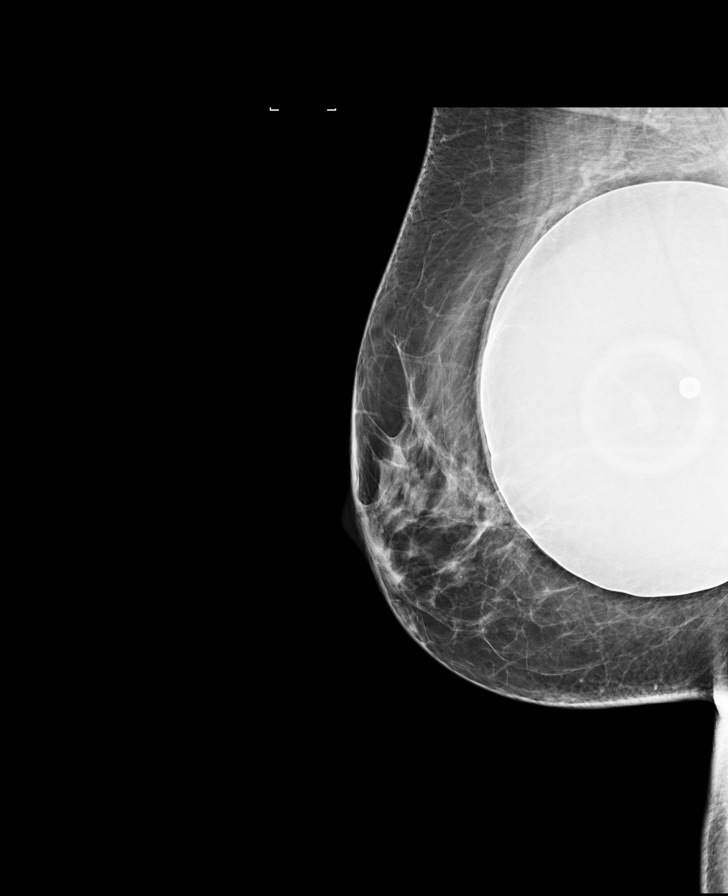

[L CC synth-2D]
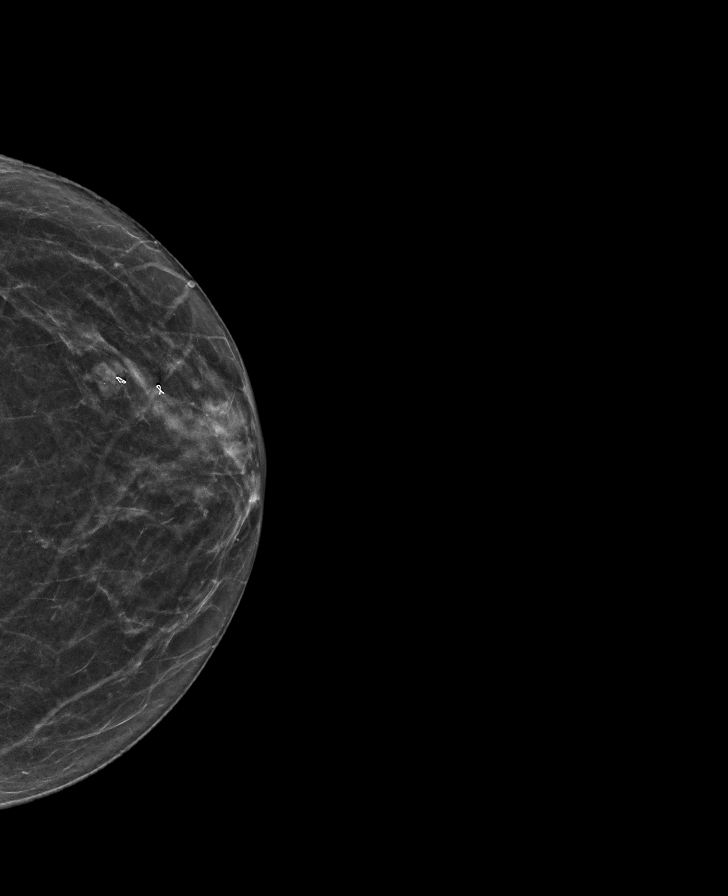

[R MLO synth-2D]
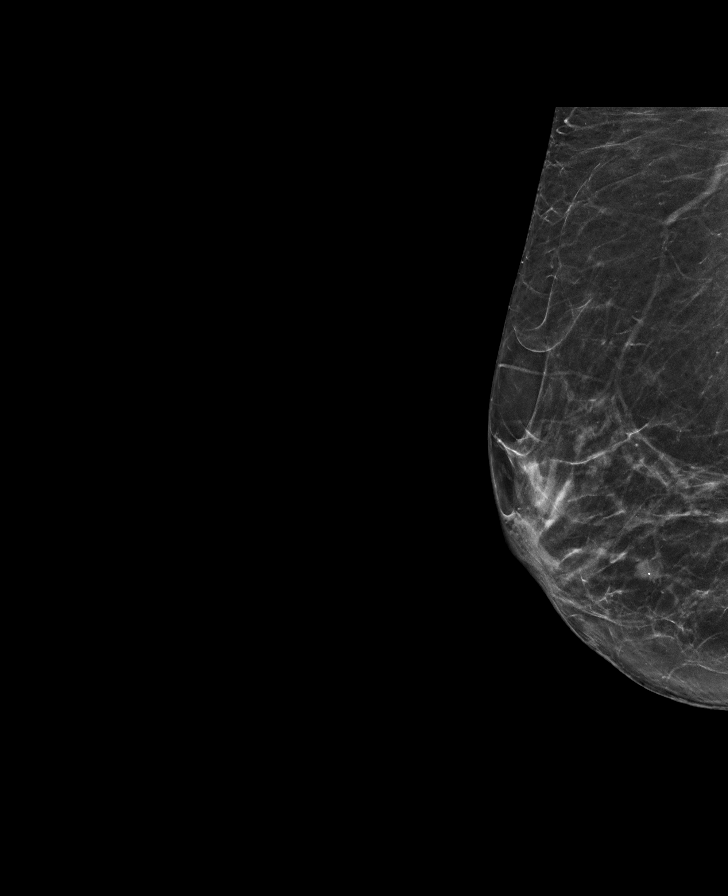

[L MLO synth-2D]
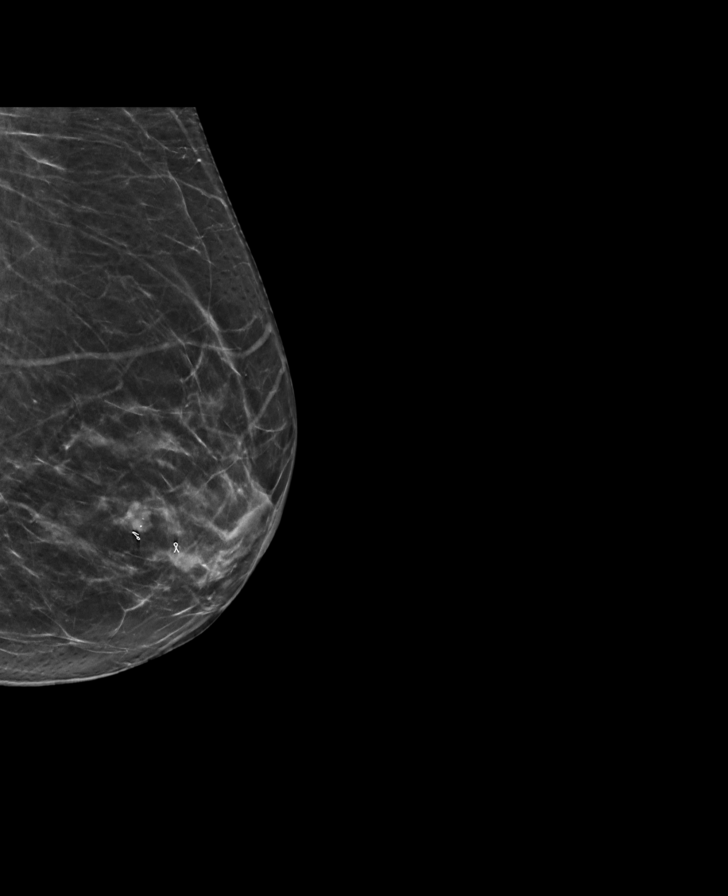

[R CC synth-2D]
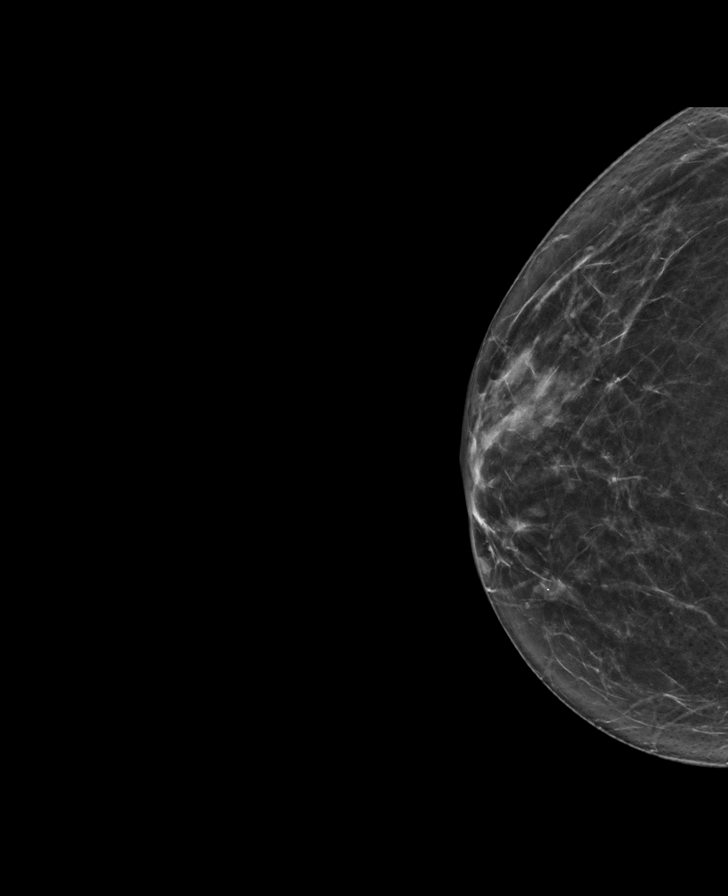

[8 of 28 positions shown; findings below may reference images not displayed]

ACR Breast Density Category b: There are scattered areas of
fibroglandular density.
FINDINGS: The patient has retropectoral implants. There are no findings
suspicious for malignancy.
IMPRESSION: No mammographic evidence of malignancy. A result letter of this
screening mammogram will be mailed directly to the patient.

RECOMMENDATION:
Screening mammogram in one year. (Code:SE-S-JMG)

BI-RADS CATEGORY  1:  Negative.

## 2022-12-28 ENCOUNTER — Other Ambulatory Visit: Payer: Self-pay | Admitting: Obstetrics & Gynecology

## 2022-12-28 DIAGNOSIS — Z1231 Encounter for screening mammogram for malignant neoplasm of breast: Secondary | ICD-10-CM

## 2023-01-27 ENCOUNTER — Ambulatory Visit
Admission: RE | Admit: 2023-01-27 | Discharge: 2023-01-27 | Disposition: A | Discharge status: Home / Self Care | Payer: BC Managed Care – PPO | Source: Ambulatory Visit | Attending: Obstetrics & Gynecology | Admitting: Obstetrics & Gynecology

## 2023-01-27 DIAGNOSIS — Z1231 Encounter for screening mammogram for malignant neoplasm of breast: Secondary | ICD-10-CM

## 2024-05-06 DIAGNOSIS — Z1231 Encounter for screening mammogram for malignant neoplasm of breast: Secondary | ICD-10-CM

## 2024-05-24 ENCOUNTER — Ambulatory Visit: Admission: RE | Admit: 2024-05-24 | Source: Ambulatory Visit

## 2024-05-24 DIAGNOSIS — Z1231 Encounter for screening mammogram for malignant neoplasm of breast: Secondary | ICD-10-CM
